# Patient Record
Sex: Male | Born: 1970 | Race: Black or African American | Hispanic: No | Marital: Married | Smoking: Never smoker
Health system: Southern US, Community
[De-identification: ages and names within clinical notes are randomized; demographics above are authoritative.]

## PROBLEM LIST (undated history)

## (undated) DIAGNOSIS — I10 Essential (primary) hypertension: Secondary | ICD-10-CM

## (undated) HISTORY — PX: HERNIA REPAIR: SHX51

---

## 2009-10-19 ENCOUNTER — Ambulatory Visit: Payer: Self-pay | Admitting: Radiology

## 2009-10-19 ENCOUNTER — Emergency Department (HOSPITAL_BASED_OUTPATIENT_CLINIC_OR_DEPARTMENT_OTHER): Admission: EM | Admit: 2009-10-19 | Discharge: 2009-10-19 | Payer: Self-pay | Admitting: Emergency Medicine

## 2010-08-12 LAB — BASIC METABOLIC PANEL
BUN: 13 mg/dL (ref 6–23)
Calcium: 9.2 mg/dL (ref 8.4–10.5)
Creatinine, Ser: 1 mg/dL (ref 0.4–1.5)
GFR calc Af Amer: >60 mL/min (ref >60)
GFR calc non Af Amer: >60 mL/min (ref >60)
Glucose, Bld: 94 mg/dL (ref 70–99)
Potassium: 3.6 mEq/L (ref 3.5–5.1)

## 2010-08-12 LAB — DIFFERENTIAL
Basophils Absolute: 0.3 10*3/uL — ABNORMAL HIGH (ref 0.0–0.1)
Basophils Relative: 4 % — ABNORMAL HIGH (ref 0–1)
Eosinophils Relative: 2 % (ref 0–5)
Lymphs Abs: 2 10*3/uL (ref 0.7–4.0)

## 2010-08-12 LAB — POCT CARDIAC MARKERS
CKMB, poc: <1 ng/mL — ABNORMAL LOW (ref 1.0–8.0)
Myoglobin, poc: 159 ng/mL (ref 12–200)
Troponin i, poc: <0.05 ng/mL (ref 0.00–0.09)

## 2010-08-12 LAB — D-DIMER, QUANTITATIVE: D-Dimer, Quant: <0.22 ug/mL-FEU (ref 0.00–0.48)

## 2010-08-12 LAB — CBC
Hemoglobin: 14.1 g/dL (ref 13.0–17.0)
RBC: 4.66 MIL/uL (ref 4.22–5.81)

## 2011-06-14 IMAGING — CR DG CHEST 2V
2 series · 2 of 2 positions shown · non-contrast
Comparison: None.

CLINICAL DATA: Chest pain.

CHEST - 2 VIEW

[w chest pa]
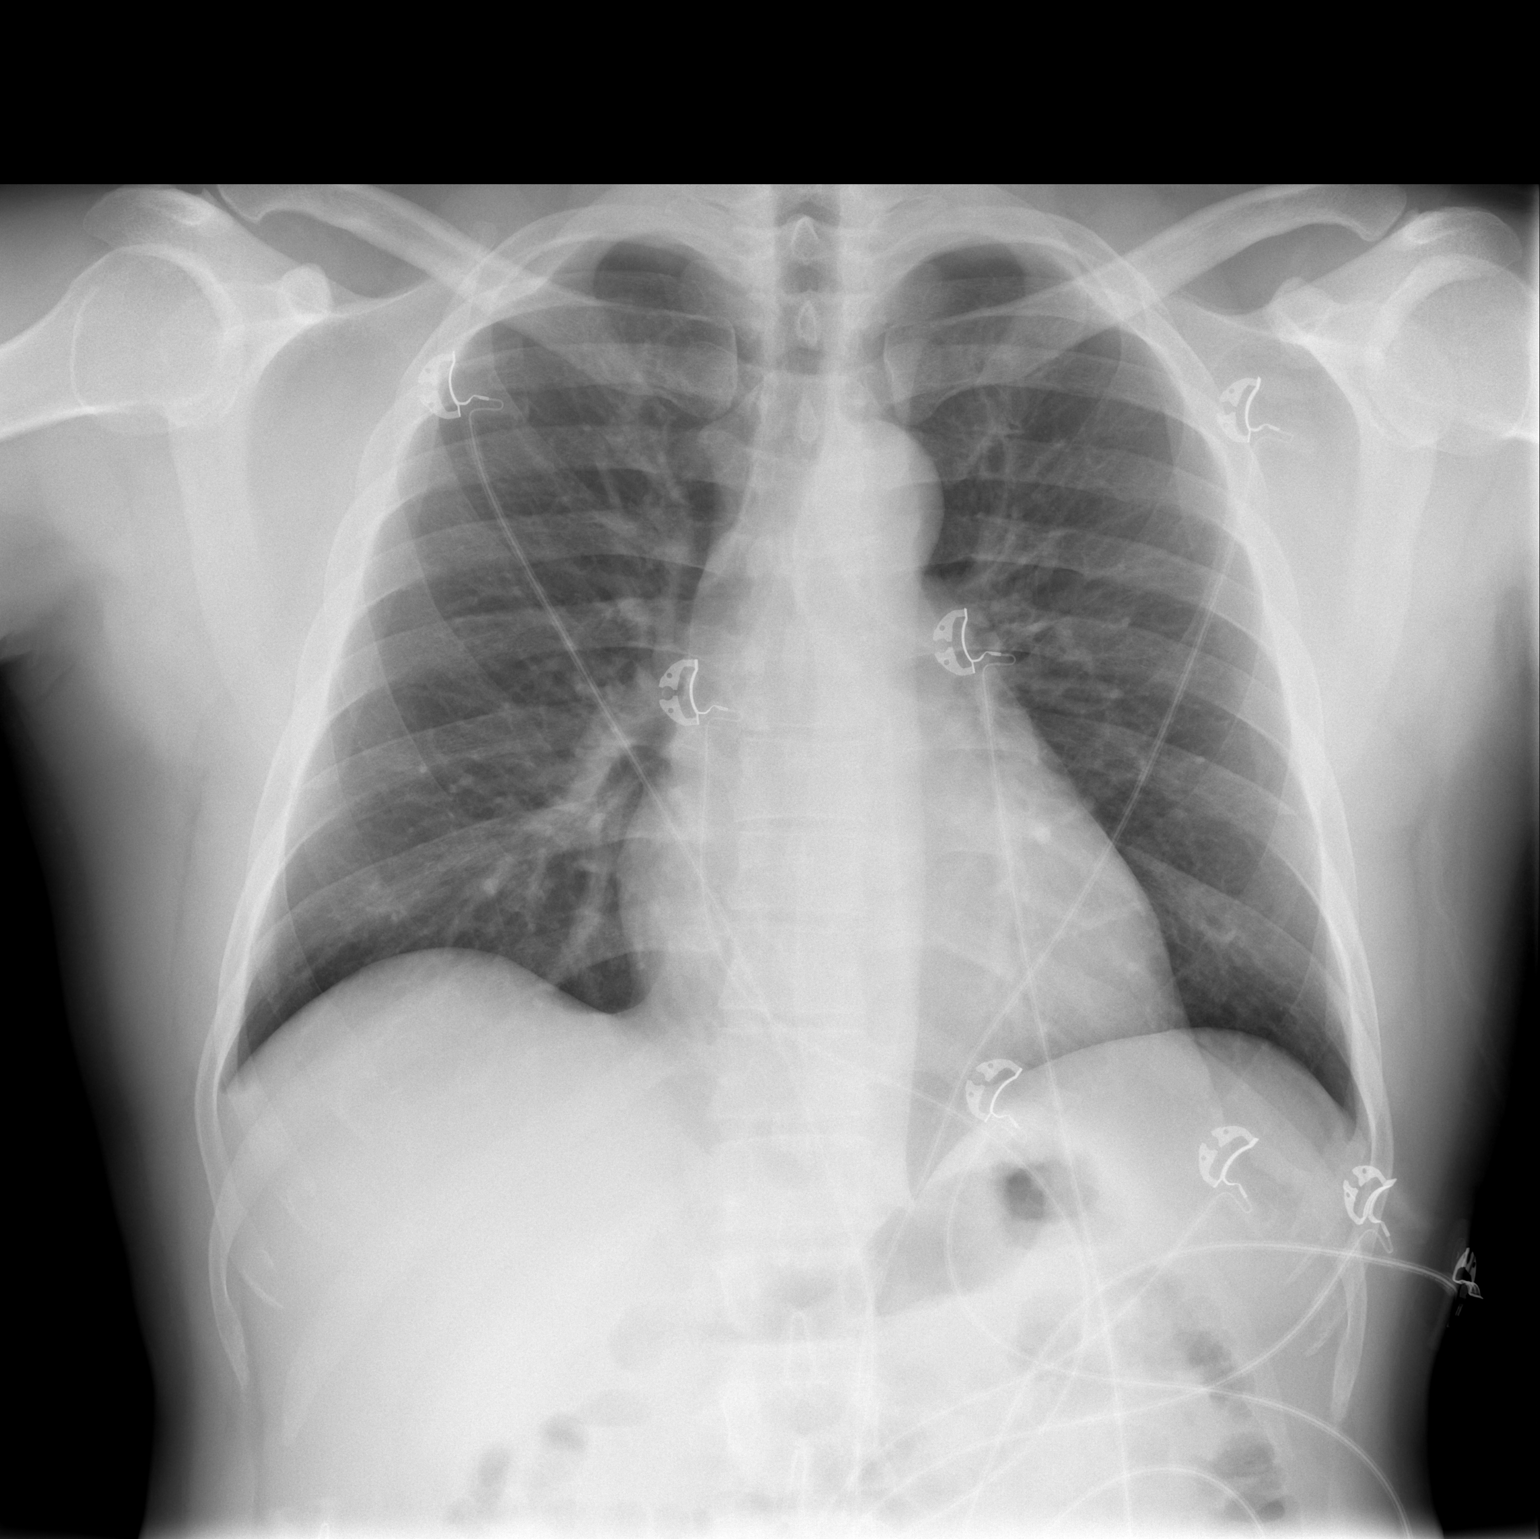

[w chest lat]
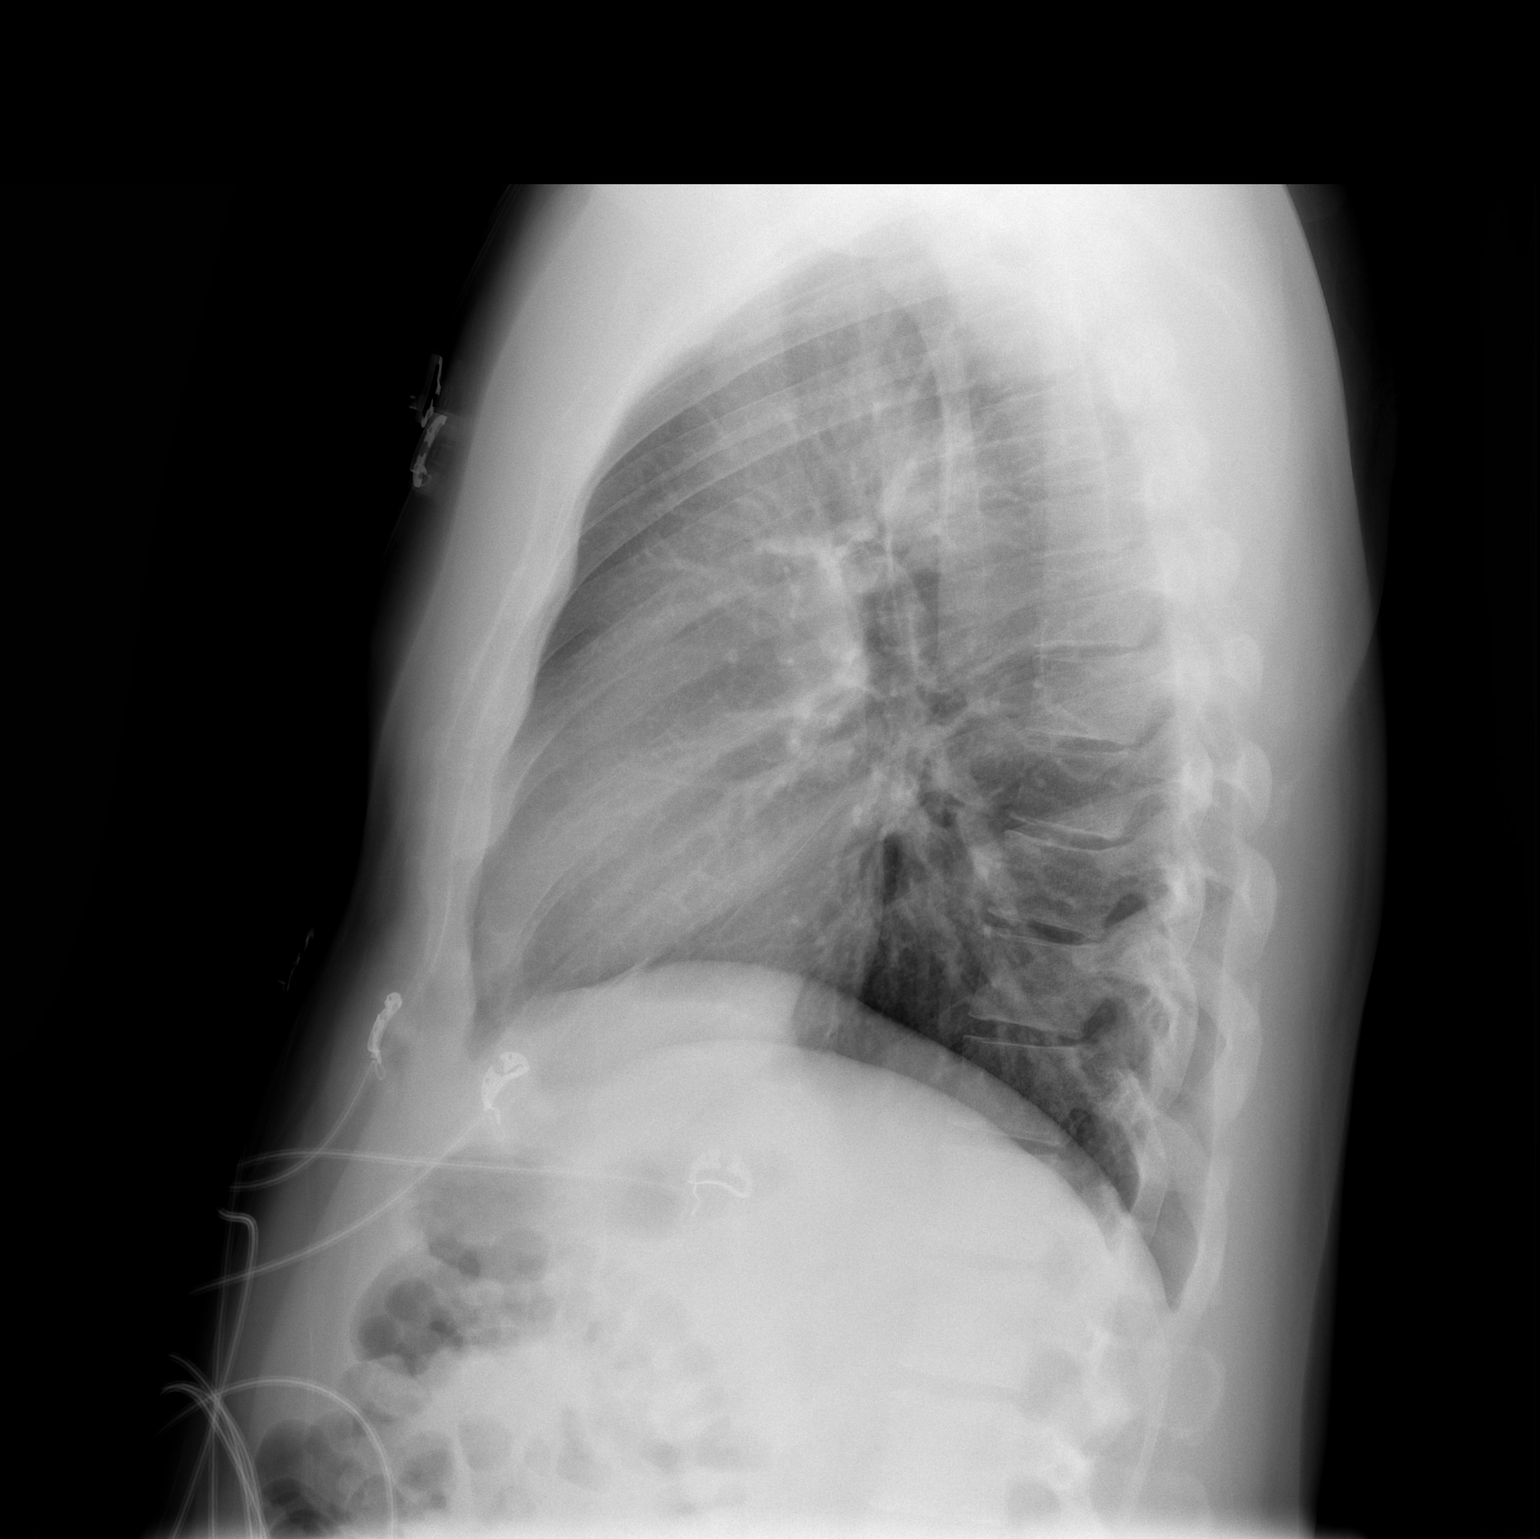

[2 of 2 positions shown; findings below may reference images not displayed]

FINDINGS: The heart size and mediastinal contours are within
normal limits.  Both lungs are clear.  The visualized skeletal
structures are unremarkable.
IMPRESSION: No active cardiopulmonary disease.

## 2016-12-31 ENCOUNTER — Emergency Department (HOSPITAL_BASED_OUTPATIENT_CLINIC_OR_DEPARTMENT_OTHER): Payer: BLUE CROSS/BLUE SHIELD

## 2016-12-31 ENCOUNTER — Encounter (HOSPITAL_BASED_OUTPATIENT_CLINIC_OR_DEPARTMENT_OTHER): Payer: Self-pay | Admitting: *Deleted

## 2016-12-31 ENCOUNTER — Emergency Department (HOSPITAL_BASED_OUTPATIENT_CLINIC_OR_DEPARTMENT_OTHER)
Admission: EM | Admit: 2016-12-31 | Discharge: 2016-12-31 | Disposition: A | Discharge status: Home / Self Care | Payer: BLUE CROSS/BLUE SHIELD | Attending: Emergency Medicine | Admitting: Emergency Medicine

## 2016-12-31 DIAGNOSIS — R0602 Shortness of breath: Secondary | ICD-10-CM | POA: Insufficient documentation

## 2016-12-31 DIAGNOSIS — I10 Essential (primary) hypertension: Secondary | ICD-10-CM | POA: Insufficient documentation

## 2016-12-31 DIAGNOSIS — Z79899 Other long term (current) drug therapy: Secondary | ICD-10-CM | POA: Diagnosis not present

## 2016-12-31 HISTORY — DX: Essential (primary) hypertension: I10

## 2016-12-31 LAB — CBC WITH DIFFERENTIAL/PLATELET
BASOS ABS: 0 10*3/uL (ref 0.0–0.1)
BASOS PCT: 1 %
EOS ABS: 0.1 10*3/uL (ref 0.0–0.7)
EOS PCT: 1 %
HCT: 41 % (ref 39.0–52.0)
Hemoglobin: 14.1 g/dL (ref 13.0–17.0)
Lymphocytes Relative: 32 %
Lymphs Abs: 1.7 10*3/uL (ref 0.7–4.0)
MCH: 31.3 pg (ref 26.0–34.0)
MCHC: 34.4 g/dL (ref 30.0–36.0)
MCV: 90.9 fL (ref 78.0–100.0)
MONO ABS: 0.7 10*3/uL (ref 0.1–1.0)
Monocytes Relative: 13 %
Neutro Abs: 2.8 10*3/uL (ref 1.7–7.7)
Neutrophils Relative %: 53 %
PLATELETS: 347 10*3/uL (ref 150–400)
RBC: 4.51 MIL/uL (ref 4.22–5.81)
RDW: 14.6 % (ref 11.5–15.5)
WBC: 5.3 10*3/uL (ref 4.0–10.5)

## 2016-12-31 LAB — BASIC METABOLIC PANEL WITH GFR
Anion gap: 10 (ref 5–15)
BUN: 18 mg/dL (ref 6–20)
CO2: 29 mmol/L (ref 22–32)
Calcium: 9.2 mg/dL (ref 8.9–10.3)
Chloride: 95 mmol/L — ABNORMAL LOW (ref 101–111)
Creatinine, Ser: 1.24 mg/dL (ref 0.61–1.24)
GFR calc Af Amer: >60 mL/min
GFR calc non Af Amer: >60 mL/min
Glucose, Bld: 108 mg/dL — ABNORMAL HIGH (ref 65–99)
Potassium: 3.2 mmol/L — ABNORMAL LOW (ref 3.5–5.1)
Sodium: 134 mmol/L — ABNORMAL LOW (ref 135–145)

## 2016-12-31 LAB — D-DIMER, QUANTITATIVE (NOT AT ARMC): D DIMER QUANT: 0.27 ug{FEU}/mL (ref 0.00–0.50)

## 2016-12-31 MED ORDER — POTASSIUM CHLORIDE CRYS ER 20 MEQ PO TBCR
EXTENDED_RELEASE_TABLET | ORAL | Status: AC
  Filled 2016-12-31: qty 1

## 2016-12-31 MED ORDER — ALBUTEROL SULFATE HFA 108 (90 BASE) MCG/ACT IN AERS: 2.0000 | INHALATION_SPRAY | RESPIRATORY_TRACT | 0 refills | Status: AC | PRN

## 2016-12-31 MED ORDER — POTASSIUM CHLORIDE CRYS ER 20 MEQ PO TBCR
10.0000 meq | EXTENDED_RELEASE_TABLET | Freq: Once | ORAL | Status: AC
  Administered 2016-12-31: 10 meq via ORAL

## 2016-12-31 MED ORDER — ALBUTEROL SULFATE HFA 108 (90 BASE) MCG/ACT IN AERS
1.0000 | INHALATION_SPRAY | Freq: Once | RESPIRATORY_TRACT | Status: AC
  Administered 2016-12-31: 1 via RESPIRATORY_TRACT
  Filled 2016-12-31: qty 6.7

## 2016-12-31 NOTE — ED Provider Notes (Signed)
[facility]   CSN: [id number] Arrival date & time: 12/31/16  1632     History   Chief Complaint Chief Complaint  Patient presents with  . Shortness of Breath    HPI Alejandro Mercado is a 46 y.o. male who presents with 3 days of intermittent shortness of breath. Patient reports that the shortness of breath occurs mainly when he is in the heat and He reports that improves if he is at rest or is in a cool environment. Patient unclear if the shortness breath is worsened with exertion but does note that he starts to exert himself on the heat he does started expressing shortness of breath. Patient reports that yesterday he started getting very winded and felt like he couldn't catch his breath while talking to his coworker. Patient also notes that while he was driving to work today he started getting very short of breath and had a pullover and return home because symptoms. Patient reports that yesterday he had some intermittent chest pressure associated with these symptoms. No episodes of nausea/vomiting or diaphoresis. Patient is currently denying any symptoms in the emergency department but just came because he wanted to get it evaluated. Patient denies any personal history of blood clots but note that his mom has had several and he thinks that there may be a "some type of blood disorder but care of him of the name." He denies any hormone, recent immobilization, prior history of DVT/PE, recent surgery, leg swelling, or long travel. Patient denies any fevers/chills, abdominal pain, nausea/vomiting, leg swelling, dysuria, hematuria. Is on a current smoker denies any history of asthma.  The history is provided by the patient.    Past Medical History:  Diagnosis Date  . Hypertension     There are no active problems to display for this patient.   History reviewed. No pertinent surgical history.     Home Medications    Prior to Admission medications   Medication Sig  Start Date End Date Taking? Authorizing Provider  lisinopril (PRINIVIL,ZESTRIL) 20 MG tablet Take 20 mg by mouth daily.   Yes [provider]  albuterol (PROVENTIL HFA;VENTOLIN HFA) 108 (90 Base) MCG/ACT inhaler Inhale 2 puffs into the lungs every 4 (four) hours as needed for wheezing or shortness of breath. 12/31/16   Maxwell Caul, PA-C    Family History History reviewed. No pertinent family history.  Social History Social History  Substance Use Topics  . Smoking status: Never Smoker  . Smokeless tobacco: Never Used  . Alcohol use Yes     Allergies   Patient has no known allergies.   Review of Systems Review of Systems  Constitutional: Negative for chills and fever.  Eyes: Negative for visual disturbance.  Respiratory: Positive for shortness of breath. Negative for cough.   Cardiovascular: Negative for chest pain.  Gastrointestinal: Negative for abdominal pain, nausea and vomiting.  Genitourinary: Negative for dysuria and hematuria.  Neurological: Negative for dizziness and headaches.     Physical Exam Updated Vital Signs BP 116/83 (BP Location: Right Arm)   Pulse 63   Temp 98.4 F (36.9 C) (Oral)   Resp 18   Ht 5\' 8"  (1.727 m)   Wt 83.9 kg (185 lb)   SpO2 100%   BMI 28.13 kg/m   Physical Exam  Constitutional: He is oriented to person, place, and time. He appears well-developed and well-nourished.  Sitting comfortably on examination table  HENT:  Head: Normocephalic and atraumatic.  Mouth/Throat: Oropharynx is  clear and moist and mucous membranes are normal.  Eyes: Pupils are equal, round, and reactive to light. Conjunctivae, EOM and lids are normal.  Neck: Full passive range of motion without pain.  Cardiovascular: Normal rate, regular rhythm, normal heart sounds and normal pulses.  Exam reveals no gallop and no friction rub.   No murmur heard. Pulmonary/Chest: Effort normal and breath sounds normal. No respiratory distress.  No evidence of  respiratory distress. Able to speak in full sentences without difficulty.  Abdominal: Soft. Normal appearance. There is no tenderness. There is no rigidity and no guarding.  Musculoskeletal: Normal range of motion.  No bilateral lower extremity edema.  Neurological: He is alert and oriented to person, place, and time.  Skin: Skin is warm and dry. Capillary refill takes less than 2 seconds.  Psychiatric: He has a normal mood and affect. His speech is normal.  Nursing note and vitals reviewed.    ED Treatments / Results  Labs (all labs ordered are listed, but only abnormal results are displayed) Labs Reviewed  BASIC METABOLIC PANEL - Abnormal; Notable for the following:       Result Value   Sodium 134 (*)    Potassium 3.2 (*)    Chloride 95 (*)    Glucose, Bld 108 (*)    All other components within normal limits  CBC WITH DIFFERENTIAL/PLATELET  D-DIMER, QUANTITATIVE (NOT AT [facility])    EKG  EKG Interpretation  Date/Time:  Wednesday December 31 2016 19:27:50 EDT Ventricular Rate:  56 PR Interval:    QRS Duration: 112 QT Interval:  461 QTC Calculation: 445 R Axis:   -6 Text Interpretation:  Sinus rhythm Incomplete right bundle branch block Baseline wander in lead(s) II III aVF V2 V3 V5 V6 When compared to prior, no significant changes seen.  No STEMI Confirmed by Theda Belfast ([id number]) on 12/31/2016 8:43:18 PM       Radiology Dg Chest 2 View  Result Date: 12/31/2016 CLINICAL DATA:  Short of breath EXAM: CHEST  2 VIEW COMPARISON:  10/19/2009 FINDINGS: Normal heart size. Lungs clear. No pneumothorax. No pleural effusion. IMPRESSION: No active cardiopulmonary disease. Electronically Signed   By: Jolaine Click M.D.   On: 12/31/2016 20:21    Procedures Procedures (including critical care time)  Medications Ordered in ED Medications  albuterol (PROVENTIL HFA;VENTOLIN HFA) 108 (90 Base) MCG/ACT inhaler 1 puff (not administered)  potassium chloride SA (K-DUR,KLOR-CON) CR tablet 10 mEq  (not administered)     Initial Impression / Assessment and Plan / ED Course  I have reviewed the triage vital signs and the nursing notes.  Pertinent labs & imaging results that were available during my care of the patient were reviewed by me and considered in my medical decision making (see chart for details).     46 year old male who presents with 3 days of shortness breath that occurs mainly while he is in the heat. Improved with rest or  cold environments. Had a few episodes of chest pressure when he got winded but no pain, nausea/vomiting, diaphoresis. No history of leg swelling. Mom is a history of blood clots in her legs and he thinks "may be some type of blood disorder but can't remember the name. Patient is afebrile, non-toxic appearing, sitting comfortably on examination table. Vital signs reviewed and stable. O2 sat is greater than 100% on room air. No evidence of respiratory distress in the department. Consider acute infectious etiology versus heat-induced asthma versus bronchospasm. History/physical exam are not concerning for ACS  etiology. Also consider PE, though low risk given history/physical exam. Plan to check CBC, BMP, EKG, d-dimer. Will also obtain chest x-ray for evaluation of infectious etiology.  Labs and imaging reviewed. Chest x-ray today for any acute infectious etiolgy. D-dimer is negative. BMP shows hypokalemia otherwise unremarkable. CBC is normal. EKG shows a sinus rhythm, rate 56 . Discussed results with patient. Plan to give oral potassium replacement here in the department. We'll plan to give albuterol inhaler for symptomatically relief. Patient instructed to follow-up with his primary care doctor next 24-48 hours further evaluation. Patient is hemodynamically stable with no evidence of respiratory distress. Patient is stable for discharge at this time. Strict return precautions discussed. Patient expresses understanding and agreement to plan.    Final Clinical  Impressions(s) / ED Diagnoses   Final diagnoses:  SOB (shortness of breath)    New Prescriptions New Prescriptions   ALBUTEROL (PROVENTIL HFA;VENTOLIN HFA) 108 (90 BASE) MCG/ACT INHALER    Inhale 2 puffs into the lungs every 4 (four) hours as needed for wheezing or shortness of breath.     Maxwell CaulLayden, Pati Thinnes A, PA-C 01/01/17 0236    Tegeler, Canary Brimhristopher J, MD 01/01/17 [phone]

## 2016-12-31 NOTE — ED Triage Notes (Signed)
SOB only when walking outside and getting hot x several days. Denies CP at this time, denies SOB at this time. Denies N/V.

## 2016-12-31 NOTE — ED Notes (Addendum)
Pt states when he goes out side he gets sob  Denies pain and no sob when in cool onset monday

## 2016-12-31 NOTE — Discharge Instructions (Signed)
Use albuterol inhaler as directed when you experience shortness of breath.  Follow-up with her primary care doctor next 24-48 hours further evaluation.  Return the emergency room for any worsening difficult to breathing, chest pain, fevers, chills, nausea vomiting or any other worsening or concerning symptoms.

## 2020-11-30 ENCOUNTER — Emergency Department (HOSPITAL_BASED_OUTPATIENT_CLINIC_OR_DEPARTMENT_OTHER)
Admission: EM | Admit: 2020-11-30 | Discharge: 2020-11-30 | Disposition: A | Discharge status: Home / Self Care | Payer: BC Managed Care – PPO | Attending: Emergency Medicine | Admitting: Emergency Medicine

## 2020-11-30 ENCOUNTER — Other Ambulatory Visit: Payer: Self-pay

## 2020-11-30 ENCOUNTER — Encounter (HOSPITAL_BASED_OUTPATIENT_CLINIC_OR_DEPARTMENT_OTHER): Payer: Self-pay | Admitting: *Deleted

## 2020-11-30 DIAGNOSIS — R1032 Left lower quadrant pain: Secondary | ICD-10-CM | POA: Insufficient documentation

## 2020-11-30 DIAGNOSIS — Z79899 Other long term (current) drug therapy: Secondary | ICD-10-CM | POA: Insufficient documentation

## 2020-11-30 DIAGNOSIS — R202 Paresthesia of skin: Secondary | ICD-10-CM | POA: Diagnosis not present

## 2020-11-30 DIAGNOSIS — R590 Localized enlarged lymph nodes: Secondary | ICD-10-CM | POA: Diagnosis present

## 2020-11-30 DIAGNOSIS — I10 Essential (primary) hypertension: Secondary | ICD-10-CM | POA: Diagnosis not present

## 2020-11-30 LAB — URINALYSIS, ROUTINE W REFLEX MICROSCOPIC
Bilirubin Urine: NEGATIVE
Glucose, UA: NEGATIVE mg/dL
Ketones, ur: NEGATIVE mg/dL
Leukocytes,Ua: NEGATIVE
Nitrite: NEGATIVE
Specific Gravity, Urine: 1.02 (ref 1.005–1.030)
pH: 6 (ref 5.0–8.0)

## 2020-11-30 LAB — URINALYSIS, MICROSCOPIC (REFLEX): Bacteria, UA: NONE SEEN

## 2020-11-30 NOTE — ED Triage Notes (Signed)
He can feel a knot in his left groin since yesterday. States his left foot gets numb and tingles. Hx of hernia repair at the same area.

## 2020-11-30 NOTE — ED Provider Notes (Signed)
[facility] EMERGENCY DEPARTMENT Provider Note   CSN: [id number] Arrival date & time: 11/30/20  1926     History Chief Complaint  Patient presents with   Groin Pain    Alejandro Mercado is a 50 y.o. male.  He 9-year-old male with past medical history below including hypertension, hyperlipidemia, left inguinal hernia repair who presents with left groin pain.  2 weeks ago, patient noticed a bump in his left inguinal area that was mildly burning.  It seemed to calm down but then flared back up again and got bigger.  It then went down and yesterday he felt like it grew larger and more painful.  Today it seems smaller again but he notes he has had some tingling in his left foot and he was worried about the possibility of a problem with his previous hernia repair.  He denies any abdominal pain, vomiting, fevers, urinary problems, cough/cold symptoms, weight loss, night sweats, or chills.  No skin changes in this area.   The history is provided by the patient.  Groin Pain      Past Medical History:  Diagnosis Date   Hypertension     There are no problems to display for this patient.   Past Surgical History:  Procedure Laterality Date   HERNIA REPAIR         No family history on file.  Social History   Tobacco Use   Smoking status: Never   Smokeless tobacco: Never  Vaping Use   Vaping Use: Never used  Substance Use Topics   Alcohol use: Yes   Drug use: No    Home Medications Prior to Admission medications   Medication Sig Start Date End Date Taking? Authorizing Provider  amLODipine (NORVASC) 5 MG tablet Take 1 tablet by mouth daily. 12/09/16  Yes [provider]  atorvastatin (LIPITOR) 20 MG tablet Take 10 mg by mouth. 12/09/16  Yes [provider]  candesartan (ATACAND) 32 MG tablet Take by mouth. 12/09/16  Yes [provider]  albuterol (PROVENTIL HFA;VENTOLIN HFA) 108 (90 Base) MCG/ACT inhaler Inhale 2 puffs into the lungs every 4  (four) hours as needed for wheezing or shortness of breath. 12/31/16   Maxwell Caul, PA-C  lisinopril (PRINIVIL,ZESTRIL) 20 MG tablet Take 20 mg by mouth daily.    [provider]    Allergies    Patient has no known allergies.  Review of Systems   Review of Systems All other systems reviewed and are negative except that which was mentioned in HPI  Physical Exam Updated Vital Signs BP (!) 154/100   Pulse 86   Temp 98.9 F (37.2 C) (Oral)   Resp 18   Ht 5\' 8"  (1.727 m)   Wt 82.6 kg   SpO2 98%   BMI 27.67 kg/m   Physical Exam Vitals and nursing note reviewed. Exam conducted with a chaperone present.  Constitutional:      General: He is not in acute distress.    Appearance: Normal appearance.  HENT:     Head: Normocephalic and atraumatic.  Eyes:     Conjunctiva/sclera: Conjunctivae normal.  Cardiovascular:     Rate and Rhythm: Normal rate and regular rhythm.     Heart sounds: Normal heart sounds. No murmur heard. Pulmonary:     Effort: Pulmonary effort is normal.     Breath sounds: Normal breath sounds.  Abdominal:     General: Abdomen is flat. Bowel sounds are normal. There is no distension.  Palpations: Abdomen is soft.     Tenderness: There is no abdominal tenderness.  Genitourinary:    Penis: Normal.      Comments: Pea-sized mobile mass in L inguinal canal with no overlying skin changes, induration, or fluctuance, not significantly tender to palpation Musculoskeletal:     Right lower leg: No edema.     Left lower leg: No edema.  Skin:    General: Skin is warm and dry.  Neurological:     Mental Status: He is alert and oriented to person, place, and time.     Comments: fluent  Psychiatric:        Mood and Affect: Mood normal.        Behavior: Behavior normal.    ED Results / Procedures / Treatments   Labs (all labs ordered are listed, but only abnormal results are displayed) Labs Reviewed  URINALYSIS, ROUTINE W REFLEX MICROSCOPIC -  Abnormal; Notable for the following components:      Result Value   Hgb urine dipstick TRACE (*)    Protein, ur TRACE (*)    All other components within normal limits  URINALYSIS, MICROSCOPIC (REFLEX)    EKG None  Radiology No results found.  Procedures Procedures   Medications Ordered in ED Medications - No data to display  ED Course  I have reviewed the triage vital signs and the nursing notes.     MDM Rules/Calculators/A&P                          PT w/ small pea-sized mass that I suspect is a small lymph node. No skin changes, warmth, or progressive swelling to suggest abscess. Given fluctuating size, I doubt malignancy. It is distant from his site of previous hernia repair and does not feel like a hernia, doubt failure of repair. Given stable sx x 2 weeks, I do not feel like he needs emergent imaging but I have recommended that he follow-up with general surgery if he still has lymphadenopathy present at the 1 month mark.  If so, he may need biopsy.  I have extensively reviewed return precautions with him including severe pain, significant enlargement, or other new symptoms such as abdominal pain or urinary symptoms.  He voiced understanding. Final Clinical Impression(s) / ED Diagnoses Final diagnoses:  Inguinal lymphadenopathy    Rx / DC Orders ED Discharge Orders     None        Steffanie Mingle, Ambrose Finland, MD 11/30/20 2155

## 2022-08-08 ENCOUNTER — Other Ambulatory Visit: Payer: Self-pay

## 2022-08-08 ENCOUNTER — Encounter (HOSPITAL_BASED_OUTPATIENT_CLINIC_OR_DEPARTMENT_OTHER): Payer: Self-pay | Admitting: Emergency Medicine

## 2022-08-08 ENCOUNTER — Emergency Department (HOSPITAL_BASED_OUTPATIENT_CLINIC_OR_DEPARTMENT_OTHER)
Admission: EM | Admit: 2022-08-08 | Discharge: 2022-08-08 | Disposition: A | Discharge status: Home / Self Care | Payer: 59 | Attending: Emergency Medicine | Admitting: Emergency Medicine

## 2022-08-08 ENCOUNTER — Emergency Department (HOSPITAL_BASED_OUTPATIENT_CLINIC_OR_DEPARTMENT_OTHER): Payer: 59

## 2022-08-08 DIAGNOSIS — E876 Hypokalemia: Secondary | ICD-10-CM | POA: Diagnosis not present

## 2022-08-08 DIAGNOSIS — Z79899 Other long term (current) drug therapy: Secondary | ICD-10-CM | POA: Insufficient documentation

## 2022-08-08 DIAGNOSIS — M792 Neuralgia and neuritis, unspecified: Secondary | ICD-10-CM

## 2022-08-08 DIAGNOSIS — M542 Cervicalgia: Secondary | ICD-10-CM | POA: Diagnosis not present

## 2022-08-08 DIAGNOSIS — M79602 Pain in left arm: Secondary | ICD-10-CM | POA: Insufficient documentation

## 2022-08-08 DIAGNOSIS — I10 Essential (primary) hypertension: Secondary | ICD-10-CM | POA: Insufficient documentation

## 2022-08-08 LAB — CBC WITH DIFFERENTIAL/PLATELET
Abs Immature Granulocytes: 0 10*3/uL (ref 0.00–0.07)
Basophils Absolute: 0 10*3/uL (ref 0.0–0.1)
Basophils Relative: 0 %
Eosinophils Absolute: 0.1 10*3/uL (ref 0.0–0.5)
Eosinophils Relative: 2 %
HCT: 39.9 % (ref 39.0–52.0)
Hemoglobin: 13.9 g/dL (ref 13.0–17.0)
Immature Granulocytes: 0 %
Lymphocytes Relative: 38 %
Lymphs Abs: 1.7 10*3/uL (ref 0.7–4.0)
MCH: 32 pg (ref 26.0–34.0)
MCHC: 34.8 g/dL (ref 30.0–36.0)
MCV: 91.9 fL (ref 80.0–100.0)
Monocytes Absolute: 0.4 10*3/uL (ref 0.1–1.0)
Monocytes Relative: 10 %
Neutro Abs: 2.3 10*3/uL (ref 1.7–7.7)
Neutrophils Relative %: 50 %
Platelets: 326 10*3/uL (ref 150–400)
RBC: 4.34 MIL/uL (ref 4.22–5.81)
RDW: 13.1 % (ref 11.5–15.5)
WBC: 4.5 10*3/uL (ref 4.0–10.5)
nRBC: 0 % (ref 0.0–0.2)

## 2022-08-08 LAB — BASIC METABOLIC PANEL
Anion gap: 7 (ref 5–15)
BUN: 10 mg/dL (ref 6–20)
CO2: 25 mmol/L (ref 22–32)
Calcium: 8.5 mg/dL — ABNORMAL LOW (ref 8.9–10.3)
Chloride: 100 mmol/L (ref 98–111)
Creatinine, Ser: 1.06 mg/dL (ref 0.61–1.24)
GFR, Estimated: >60 mL/min (ref >60)
Glucose, Bld: 106 mg/dL — ABNORMAL HIGH (ref 70–99)
Potassium: 2.9 mmol/L — ABNORMAL LOW (ref 3.5–5.1)
Sodium: 132 mmol/L — ABNORMAL LOW (ref 135–145)

## 2022-08-08 LAB — TROPONIN I (HIGH SENSITIVITY)
Troponin I (High Sensitivity): 7 ng/L (ref <18)
Troponin I (High Sensitivity): 8 ng/L (ref <18)

## 2022-08-08 MED ORDER — POTASSIUM CHLORIDE CRYS ER 20 MEQ PO TBCR
60.0000 meq | EXTENDED_RELEASE_TABLET | Freq: Once | ORAL | Status: AC
  Administered 2022-08-08: 60 meq via ORAL
  Filled 2022-08-08: qty 3

## 2022-08-08 MED ORDER — LIDOCAINE 5 % EX PTCH
1.0000 | MEDICATED_PATCH | CUTANEOUS | Status: DC
  Administered 2022-08-08: 1 via TRANSDERMAL
  Filled 2022-08-08: qty 1

## 2022-08-08 MED ORDER — ACETAMINOPHEN 500 MG PO TABS
1000.0000 mg | ORAL_TABLET | Freq: Once | ORAL | Status: AC
  Administered 2022-08-08: 1000 mg via ORAL
  Filled 2022-08-08: qty 2

## 2022-08-08 NOTE — ED Triage Notes (Signed)
Pt states his blood pressure has been high for the past 2 days  Pt has hx of hypertension  Pt states last week he started having tightness in the left side of his neck  Pt states that his left arm has been hurting and he has been having a burning sensation in the left side of his chest

## 2022-08-08 NOTE — ED Provider Notes (Signed)
[facility] Provider Note   CSN: [id number] Arrival date & time: 08/08/22  0315     History  Chief Complaint  Patient presents with   Hypertension    Alejandro Mercado is a 52 y.o. male.  The history is provided by the patient.  Hypertension This is a chronic problem. The current episode started more than 1 week ago. The problem occurs constantly. The problem has been gradually worsening. Associated symptoms include chest pain. Pertinent negatives include no abdominal pain, no headaches and no shortness of breath. Nothing aggravates the symptoms. Nothing relieves the symptoms. The treatment provided no relief.  Patient has neck pain that is burning radiating down the arm.  Patient has been taking his medication intermittently and not at the same time.       Home Medications Prior to Admission medications   Medication Sig Start Date End Date Taking? Authorizing Provider  amLODipine (NORVASC) 5 MG tablet Take 1 tablet by mouth daily. 12/09/16  Yes [provider]  atorvastatin (LIPITOR) 20 MG tablet Take 10 mg by mouth. 12/09/16  Yes [provider]  candesartan (ATACAND) 32 MG tablet Take by mouth. 12/09/16  Yes [provider]  carvedilol (COREG) 6.25 MG tablet Take 6.25 mg by mouth daily.   Yes [provider]  albuterol (PROVENTIL HFA;VENTOLIN HFA) 108 (90 Base) MCG/ACT inhaler Inhale 2 puffs into the lungs every 4 (four) hours as needed for wheezing or shortness of breath. 12/31/16   Volanda Napoleon, PA-C  lisinopril (PRINIVIL,ZESTRIL) 20 MG tablet Take 20 mg by mouth daily.    [provider]      Allergies    Patient has no known allergies.    Review of Systems   Review of Systems  Constitutional:  Negative for fever.  HENT:  Negative for facial swelling.   Eyes:  Negative for redness.  Respiratory:  Negative for shortness of breath.   Cardiovascular:  Positive for chest pain.   Gastrointestinal:  Negative for abdominal pain.  Neurological:  Negative for facial asymmetry, speech difficulty, weakness, numbness and headaches.  All other systems reviewed and are negative.   Physical Exam Updated Vital Signs BP (!) 158/102   Pulse 66   Temp 98.5 F (36.9 C) (Oral)   Resp 18   Ht 5\' 9"  (1.753 m)   Wt 81.6 kg   SpO2 99%   BMI 26.58 kg/m  Physical Exam Vitals and nursing note reviewed.  Constitutional:      General: He is not in acute distress.    Appearance: Normal appearance. He is well-developed. He is not diaphoretic.  HENT:     Head: Normocephalic and atraumatic.     Nose: Nose normal.  Eyes:     Conjunctiva/sclera: Conjunctivae normal.     Pupils: Pupils are equal, round, and reactive to light.  Cardiovascular:     Rate and Rhythm: Normal rate and regular rhythm.     Pulses: Normal pulses.     Heart sounds: Normal heart sounds.  Pulmonary:     Effort: Pulmonary effort is normal.     Breath sounds: Normal breath sounds. No wheezing or rales.  Abdominal:     General: Bowel sounds are normal.     Palpations: Abdomen is soft.     Tenderness: There is no abdominal tenderness. There is no guarding or rebound.  Musculoskeletal:        General: Normal range of motion.     Cervical  back: Normal range of motion and neck supple.  Skin:    General: Skin is warm and dry.     Capillary Refill: Capillary refill takes less than 2 seconds.  Neurological:     General: No focal deficit present.     Mental Status: He is alert and oriented to person, place, and time.     Deep Tendon Reflexes: Reflexes normal.  Psychiatric:        Mood and Affect: Mood normal.        Behavior: Behavior normal.     ED Results / Procedures / Treatments   Labs (all labs ordered are listed, but only abnormal results are displayed) Results for orders placed or performed during the hospital encounter of 08/08/22  CBC with Differential  Result Value Ref Range   WBC 4.5 4.0 -  10.5 K/uL   RBC 4.34 4.22 - 5.81 MIL/uL   Hemoglobin 13.9 13.0 - 17.0 g/dL   HCT 39.9 39.0 - 52.0 %   MCV 91.9 80.0 - 100.0 fL   MCH 32.0 26.0 - 34.0 pg   MCHC 34.8 30.0 - 36.0 g/dL   RDW 13.1 11.5 - 15.5 %   Platelets 326 150 - 400 K/uL   nRBC 0.0 0.0 - 0.2 %   Neutrophils Relative % 50 %   Neutro Abs 2.3 1.7 - 7.7 K/uL   Lymphocytes Relative 38 %   Lymphs Abs 1.7 0.7 - 4.0 K/uL   Monocytes Relative 10 %   Monocytes Absolute 0.4 0.1 - 1.0 K/uL   Eosinophils Relative 2 %   Eosinophils Absolute 0.1 0.0 - 0.5 K/uL   Basophils Relative 0 %   Basophils Absolute 0.0 0.0 - 0.1 K/uL   Immature Granulocytes 0 %   Abs Immature Granulocytes 0.00 0.00 - 0.07 K/uL  Basic metabolic panel  Result Value Ref Range   Sodium 132 (L) 135 - 145 mmol/L   Potassium 2.9 (L) 3.5 - 5.1 mmol/L   Chloride 100 98 - 111 mmol/L   CO2 25 22 - 32 mmol/L   Glucose, Bld 106 (H) 70 - 99 mg/dL   BUN 10 6 - 20 mg/dL   Creatinine, Ser 1.06 0.61 - 1.24 mg/dL   Calcium 8.5 (L) 8.9 - 10.3 mg/dL   GFR, Estimated >60 >60 mL/min   Anion gap 7 5 - 15  Troponin I (High Sensitivity)  Result Value Ref Range   Troponin I (High Sensitivity) 7 <18 ng/L  Troponin I (High Sensitivity)  Result Value Ref Range   Troponin I (High Sensitivity) 8 <18 ng/L   DG Chest Portable 1 View  Result Date: 08/08/2022 CLINICAL DATA:  Hypertension and left-sided burning in chest. EXAM: PORTABLE CHEST 1 VIEW COMPARISON:  12/31/2016. FINDINGS: The heart is borderline enlarged and the mediastinal contour is within normal limits. No consolidation, effusion, or pneumothorax. No acute osseous abnormality. IMPRESSION: No active disease. Electronically Signed   By: Brett Fairy M.D.   On: 08/08/2022 04:22     EKG See muse   Radiology DG Chest Portable 1 View  Result Date: 08/08/2022 CLINICAL DATA:  Hypertension and left-sided burning in chest. EXAM: PORTABLE CHEST 1 VIEW COMPARISON:  12/31/2016. FINDINGS: The heart is borderline enlarged and  the mediastinal contour is within normal limits. No consolidation, effusion, or pneumothorax. No acute osseous abnormality. IMPRESSION: No active disease. Electronically Signed   By: Brett Fairy M.D.   On: 08/08/2022 04:22    Procedures Procedures    Medications Ordered in  ED Medications  lidocaine (LIDODERM) 5 % 1 patch (1 patch Transdermal Patch Applied 08/08/22 0512)  potassium chloride SA (KLOR-CON M) CR tablet 60 mEq (60 mEq Oral Given 08/08/22 0512)  acetaminophen (TYLENOL) tablet 1,000 mg (1,000 mg Oral Given 08/08/22 [id number])    ED Course/ Medical Decision Making/ A&P                             Medical Decision Making Patient with HTN who has had worsening HTN, he has had neck pain that burns and radiates down the arm   Problems Addressed: Hypokalemia: acute illness or injury    Details: Potassium replenished in the ED Primary hypertension: chronic illness or injury    Details: BP came down without intervention.  Patient to take medication as directed  Radicular pain in left arm:    Details: Alternate tylenol and ibuprofen.    Amount and/or Complexity of Data Reviewed Independent Historian: spouse    Details: See above  External Data Reviewed: notes.    Details: Previous notes reviewed  Labs: ordered.    Details: All labs reviewed: 2 normal troponins 7/8.  Sodium 132, potassium low 2.9, normal creatinine 1.06.  normal white count 4.5, normal hemoglobin 13.9, normal platelet count 326 Radiology: ordered and independent interpretation performed.    Details: Normal CXR by me  ECG/medicine tests: ordered and independent interpretation performed.    Details: HR 83, LAFB  Risk OTC drugs. Prescription drug management. Risk Details: BP improved on own in the ED without intervention.  Pain in the neck is radiacular in nature.  Patient has ruled out for MI in the Heart score is 2 low risk for MACE.  Symptoms are not consistent with PE.  Stable for discharge.  Take your  medication as directed.  Follow up with PMD this week for adjustment of medication.      Final Clinical Impression(s) / ED Diagnoses Final diagnoses:  Primary hypertension  Hypokalemia   Return for intractable cough, coughing up blood, fevers > 100.4 unrelieved by medication, shortness of breath, intractable vomiting, chest pain, shortness of breath, weakness, numbness, changes in speech, facial asymmetry, abdominal pain, passing out, Inability to tolerate liquids or food, cough, altered mental status or any concerns. No signs of systemic illness or infection. The patient is nontoxic-appearing on exam and vital signs are within normal limits.  I have reviewed the triage vital signs and the nursing notes. Pertinent labs & imaging results that were available during my care of the patient were reviewed by me and considered in my medical decision making (see chart for details). After history, exam, and medical workup I feel the patient has been appropriately medically screened and is safe for discharge home. Pertinent diagnoses were discussed with the patient. Patient was given return precautions. Rx / DC Orders ED Discharge Orders     None         Sina Lucchesi, MD 08/08/22 [phone]

## 2024-05-17 ENCOUNTER — Emergency Department (HOSPITAL_BASED_OUTPATIENT_CLINIC_OR_DEPARTMENT_OTHER)
Admission: EM | Admit: 2024-05-17 | Discharge: 2024-05-17 | Disposition: A | Discharge status: Home / Self Care | Payer: PRIVATE HEALTH INSURANCE | Attending: Emergency Medicine | Admitting: Emergency Medicine

## 2024-05-17 ENCOUNTER — Encounter (HOSPITAL_BASED_OUTPATIENT_CLINIC_OR_DEPARTMENT_OTHER): Payer: Self-pay

## 2024-05-17 ENCOUNTER — Other Ambulatory Visit: Payer: Self-pay

## 2024-05-17 DIAGNOSIS — J111 Influenza due to unidentified influenza virus with other respiratory manifestations: Secondary | ICD-10-CM | POA: Insufficient documentation

## 2024-05-17 DIAGNOSIS — Z79899 Other long term (current) drug therapy: Secondary | ICD-10-CM | POA: Insufficient documentation

## 2024-05-17 DIAGNOSIS — R509 Fever, unspecified: Secondary | ICD-10-CM | POA: Insufficient documentation

## 2024-05-17 DIAGNOSIS — R112 Nausea with vomiting, unspecified: Secondary | ICD-10-CM

## 2024-05-17 DIAGNOSIS — R42 Dizziness and giddiness: Secondary | ICD-10-CM | POA: Diagnosis present

## 2024-05-17 DIAGNOSIS — I10 Essential (primary) hypertension: Secondary | ICD-10-CM | POA: Insufficient documentation

## 2024-05-17 DIAGNOSIS — R739 Hyperglycemia, unspecified: Secondary | ICD-10-CM

## 2024-05-17 LAB — CBC WITH DIFFERENTIAL/PLATELET
Abs Immature Granulocytes: 0.03 K/uL (ref 0.00–0.07)
Basophils Absolute: 0 K/uL (ref 0.0–0.1)
Basophils Relative: 0 %
Eosinophils Absolute: 0 K/uL (ref 0.0–0.5)
Eosinophils Relative: 0 %
HCT: 40.8 % (ref 39.0–52.0)
Hemoglobin: 14.4 g/dL (ref 13.0–17.0)
Immature Granulocytes: 0 %
Lymphocytes Relative: 4 %
Lymphs Abs: 0.3 K/uL — ABNORMAL LOW (ref 0.7–4.0)
MCH: 32.3 pg (ref 26.0–34.0)
MCHC: 35.3 g/dL (ref 30.0–36.0)
MCV: 91.5 fL (ref 80.0–100.0)
Monocytes Absolute: 0.3 K/uL (ref 0.1–1.0)
Monocytes Relative: 4 %
Neutro Abs: 6.7 K/uL (ref 1.7–7.7)
Neutrophils Relative %: 92 %
Platelets: 306 K/uL (ref 150–400)
RBC: 4.46 MIL/uL (ref 4.22–5.81)
RDW: 13.8 % (ref 11.5–15.5)
WBC: 7.3 K/uL (ref 4.0–10.5)
nRBC: 0 % (ref 0.0–0.2)

## 2024-05-17 LAB — BASIC METABOLIC PANEL WITH GFR
Anion gap: 13 (ref 5–15)
BUN: 11 mg/dL (ref 6–20)
CO2: 25 mmol/L (ref 22–32)
Calcium: 8.5 mg/dL — ABNORMAL LOW (ref 8.9–10.3)
Chloride: 99 mmol/L (ref 98–111)
Creatinine, Ser: 0.87 mg/dL (ref 0.61–1.24)
GFR, Estimated: >60 mL/min
Glucose, Bld: 115 mg/dL — ABNORMAL HIGH (ref 70–99)
Potassium: 3.7 mmol/L (ref 3.5–5.1)
Sodium: 137 mmol/L (ref 135–145)

## 2024-05-17 LAB — RESP PANEL BY RT-PCR (RSV, FLU A&B, COVID)  RVPGX2
Influenza A by PCR: NEGATIVE
Influenza B by PCR: NEGATIVE
Resp Syncytial Virus by PCR: NEGATIVE
SARS Coronavirus 2 by RT PCR: NEGATIVE

## 2024-05-17 MED ORDER — ONDANSETRON HCL 4 MG/2ML IJ SOLN
4.0000 mg | Freq: Once | INTRAMUSCULAR | Status: AC
  Administered 2024-05-17: 4 mg via INTRAVENOUS
  Filled 2024-05-17: qty 2

## 2024-05-17 MED ORDER — KETOROLAC TROMETHAMINE 30 MG/ML IJ SOLN
30.0000 mg | Freq: Once | INTRAMUSCULAR | Status: AC
  Administered 2024-05-17: 30 mg via INTRAVENOUS
  Filled 2024-05-17: qty 1

## 2024-05-17 MED ORDER — SODIUM CHLORIDE 0.9 % IV BOLUS
1000.0000 mL | Freq: Once | INTRAVENOUS | Status: AC
  Administered 2024-05-17: 1000 mL via INTRAVENOUS

## 2024-05-17 MED ORDER — ACETAMINOPHEN 325 MG PO TABS
650.0000 mg | ORAL_TABLET | Freq: Once | ORAL | Status: AC
  Administered 2024-05-17: 650 mg via ORAL
  Filled 2024-05-17: qty 2

## 2024-05-17 MED ORDER — ONDANSETRON 4 MG PO TBDP: 4.0000 mg | ORAL_TABLET | Freq: Three times a day (TID) | ORAL | 0 refills | Status: AC | PRN

## 2024-05-17 NOTE — Discharge Instructions (Addendum)
 Drink plenty of fluids.  Take acetaminophen  and/or ibuprofen as needed for fever.  If you develop diarrhea, you may take loperamide (Imodium A-D) as needed.

## 2024-05-17 NOTE — ED Triage Notes (Signed)
 Patient brought in by POV with wife stating that patient have been dizzy for about 3 days.

## 2024-05-17 NOTE — ED Provider Notes (Signed)
 "  EMERGENCY DEPARTMENT AT [facility] Provider Note   CSN: [id number] Arrival date & time: 05/17/24  9479     Patient presents with: Dizziness   Alejandro Mercado is a 53 y.o. male.   The history is provided by the patient.  Dizziness  He has history of hypertension and comes in because of an episode of vomiting and dizziness.  He states that he woke up and vomited several times and has felt dizzy.  There has been some slight bodyaches.  He denies rhinorrhea or sore throat or cough.  He denies any diarrhea.  He denies any sick contacts.  He was not aware of any fever or chills or sweats.    Prior to Admission medications  Medication Sig Start Date End Date Taking? Authorizing Provider  albuterol  (PROVENTIL  HFA;VENTOLIN  HFA) 108 (90 Base) MCG/ACT inhaler Inhale 2 puffs into the lungs every 4 (four) hours as needed for wheezing or shortness of breath. 12/31/16   Layden, Lindsey A, PA-C  amLODipine (NORVASC) 5 MG tablet Take 1 tablet by mouth daily. 12/09/16   [provider]  atorvastatin (LIPITOR) 20 MG tablet Take 10 mg by mouth. 12/09/16   [provider]  candesartan (ATACAND) 32 MG tablet Take by mouth. 12/09/16   [provider]  carvedilol (COREG) 6.25 MG tablet Take 6.25 mg by mouth daily.    [provider]  lisinopril (PRINIVIL,ZESTRIL) 20 MG tablet Take 20 mg by mouth daily.    [provider]    Allergies: Patient has no known allergies.    Review of Systems  Neurological:  Positive for dizziness.  All other systems reviewed and are negative.   Updated Vital Signs BP (!) 143/96   Pulse (!) 118   Temp (!) 102 F (38.9 C) (Oral)   Resp (!) 22   Ht 5' 10 (1.778 m)   Wt 83.5 kg   SpO2 98%   BMI 26.40 kg/m   Physical Exam Vitals and nursing note reviewed.   53 year old male, resting comfortably and in no acute distress. Vital signs are significant for elevated temperature, heart rate, respiratory rate,  blood pressure. Oxygen saturation is 98%, which is normal. Head is normocephalic and atraumatic. PERRLA, EOMI. Oropharynx is clear. Lungs are clear without rales, wheezes, or rhonchi. Chest is nontender. Heart has regular rate and rhythm without murmur. Abdomen is soft, flat, nontender. Skin is warm and dry without rash. Neurologic: Mental status is normal, cranial nerves are intact, moves all extremities equally.  (all labs ordered are listed, but only abnormal results are displayed) Labs Reviewed  CBC WITH DIFFERENTIAL/PLATELET - Abnormal; Notable for the following components:      Result Value   Lymphs Abs 0.3 (*)    All other components within normal limits  BASIC METABOLIC PANEL WITH GFR - Abnormal; Notable for the following components:   Glucose, Bld 115 (*)    Calcium 8.5 (*)    All other components within normal limits  RESP PANEL BY RT-PCR (RSV, FLU A&B, COVID)  [id number]     Procedures   Medications Ordered in the ED  sodium chloride  0.9 % bolus 1,000 mL (has no administration in time range)  ondansetron  (ZOFRAN ) injection 4 mg (has no administration in time range)                                    Medical Decision  Making Amount and/or Complexity of Data Reviewed Labs: ordered.  Risk Prescription drug management.   Fever with vomiting.  Differential diagnosis includes, but is not limited to, viral gastritis, influenza, COVID-19.  I have ordered IV fluids, ondansetron , ketorolac .  I have ordered screening labs including viral respiratory pathogen panel.  I have reviewed his laboratory tests, and my interpretation is negative PCR for COVID-19 and influenza and RSV, elevated random glucose level and otherwise normal basic metabolic panel, normals CBC.  He feels much better following above-noted treatment.  I have ordered a dose of acetaminophen  as well.  I am discharging with a prescription for ondansetron  oral dissolving tablet, advised to maintain adequate hydration  and use over-the-counter NSAIDs and acetaminophen  as needed for fever or aching.     Final diagnoses:  Influenza-like illness  Fever in adult  Nausea and vomiting, unspecified vomiting type  Elevated random blood glucose level    ED Discharge Orders          Ordered    ondansetron  (ZOFRAN -ODT) 4 MG disintegrating tablet  Every 8 hours PRN        05/17/24 0659               Raford Lenis, MD 05/17/24 [phone]  "
